# Patient Record
Sex: Male | Born: 1960 | Race: Black or African American | Hispanic: No | Marital: Married | Smoking: Never smoker
Health system: Southern US, Community
[De-identification: ages and names within clinical notes are randomized; demographics above are authoritative.]

## PROBLEM LIST (undated history)

## (undated) DIAGNOSIS — K219 Gastro-esophageal reflux disease without esophagitis: Secondary | ICD-10-CM

---

## 2015-02-01 ENCOUNTER — Encounter (HOSPITAL_COMMUNITY): Payer: Self-pay | Admitting: Emergency Medicine

## 2015-02-01 ENCOUNTER — Emergency Department (HOSPITAL_COMMUNITY)
Admission: EM | Admit: 2015-02-01 | Discharge: 2015-02-01 | Disposition: A | Discharge status: Home / Self Care | Payer: Medicaid Other | Attending: Emergency Medicine | Admitting: Emergency Medicine

## 2015-02-01 DIAGNOSIS — Z8719 Personal history of other diseases of the digestive system: Secondary | ICD-10-CM | POA: Insufficient documentation

## 2015-02-01 DIAGNOSIS — B86 Scabies: Secondary | ICD-10-CM | POA: Insufficient documentation

## 2015-02-01 DIAGNOSIS — R21 Rash and other nonspecific skin eruption: Secondary | ICD-10-CM | POA: Diagnosis present

## 2015-02-01 HISTORY — DX: Gastro-esophageal reflux disease without esophagitis: K21.9

## 2015-02-01 MED ORDER — PERMETHRIN 5 % EX CREA: TOPICAL_CREAM | CUTANEOUS | Status: AC

## 2015-02-01 MED ORDER — HYDROXYZINE HCL 25 MG PO TABS: 25.0000 mg | ORAL_TABLET | Freq: Four times a day (QID) | ORAL | Status: AC

## 2015-02-01 MED ORDER — PREDNISONE 20 MG PO TABS
60.0000 mg | ORAL_TABLET | Freq: Once | ORAL | Status: AC
  Administered 2015-02-01: 60 mg via ORAL
  Filled 2015-02-01: qty 3

## 2015-02-01 MED ORDER — HYDROXYZINE HCL 25 MG PO TABS
50.0000 mg | ORAL_TABLET | Freq: Once | ORAL | Status: AC
  Administered 2015-02-01: 50 mg via ORAL
  Filled 2015-02-01: qty 2

## 2015-02-01 NOTE — Discharge Instructions (Signed)

## 2015-02-01 NOTE — ED Notes (Signed)
Pt reporting rash after being bit by bugs several days ago. No resp distress noted

## 2015-02-01 NOTE — ED Provider Notes (Signed)
CSN: [id number]     Arrival date & time 02/01/15  0306 History  By signing my name below, I, Shawn Carpenter, attest that this documentation has been prepared under the direction and in the presence of Rolland Porter, MD . Electronically Signed: Freida Carpenter, Scribe. 02/01/2015. 3:43 AM.   Chief Complaint  Patient presents with  . Rash   The history is provided by the patient. No language interpreter was used.    HPI Comments:  Shawn Carpenter is a 54 y.o. male who presents to the Emergency Department complaining of a diffuse rash which he first noticed a few days ago. He reports associated pruritus. Pt believes he may have been bitten by bed bugs or may have scabies. His wife has a similar rash. No alleviating factors or associated symptoms noted.  Past Medical History  Diagnosis Date  . GERD (gastroesophageal reflux disease)    History reviewed. No pertinent past surgical history. No family history on file. Social History  Substance Use Topics  . Smoking status: Never Smoker   . Smokeless tobacco: None  . Alcohol Use: No    Review of Systems  Constitutional: Negative for fever, chills, diaphoresis, appetite change and fatigue.  HENT: Negative for mouth sores, sore throat and trouble swallowing.   Eyes: Negative for visual disturbance.  Respiratory: Negative for cough, chest tightness, shortness of breath and wheezing.   Cardiovascular: Negative for chest pain.  Gastrointestinal: Negative for nausea, vomiting, abdominal pain, diarrhea and abdominal distention.  Endocrine: Negative for polydipsia, polyphagia and polyuria.  Genitourinary: Negative for dysuria, frequency and hematuria.  Musculoskeletal: Negative for gait problem.  Skin: Positive for rash. Negative for color change and pallor.  Neurological: Negative for dizziness, syncope, light-headedness and headaches.  Hematological: Does not bruise/bleed easily.  Psychiatric/Behavioral: Negative for behavioral problems and  confusion.    Allergies  Aspirin and Tylenol  Home Medications   Prior to Admission medications   Medication Sig Start Date End Date Taking? Authorizing Provider  hydrOXYzine (ATARAX/VISTARIL) 25 MG tablet Take 1 tablet (25 mg total) by mouth every 6 (six) hours. 02/01/15   Rolland Porter, MD  permethrin (ELIMITE) 5 % cream Apply to skin from the neck down. Repeat in one week 02/01/15   Rolland Porter, MD   BP 119/71 mmHg  Pulse 83  Temp(Src) 98.1 F (36.7 C) (Oral)  Resp 16  SpO2 97% Physical Exam  Constitutional: He is oriented to person, place, and time. He appears well-developed and well-nourished. No distress.  HENT:  Head: Normocephalic.  Eyes: Conjunctivae are normal. Pupils are equal, round, and reactive to light. No scleral icterus.  Neck: Normal range of motion. Neck supple. No thyromegaly present.  Cardiovascular: Normal rate and regular rhythm.  Exam reveals no gallop and no friction rub.   No murmur heard. Pulmonary/Chest: Effort normal and breath sounds normal. No respiratory distress. He has no wheezes. He has no rales.  Abdominal: Soft. Bowel sounds are normal. He exhibits no distension. There is no tenderness. There is no rebound.  Musculoskeletal: Normal range of motion.  Neurological: He is alert and oriented to person, place, and time.  Skin: Skin is warm and dry.  Small areas of localized erythema with excoriations   Psychiatric: He has a normal mood and affect. His behavior is normal.  Nursing note and vitals reviewed.   ED Course  Procedures   DIAGNOSTIC STUDIES:  Oxygen Saturation is 97% on RA, normal by my interpretation.    COORDINATION OF CARE:  3:42 AM  Discussed treatment plan with pt at bedside and pt agreed to plan.  Labs Review Labs Reviewed - No data to display  Imaging Review No results found.   EKG Interpretation None      MDM   Final diagnoses:  Scabies    I personally performed the services described in this documentation,  which was scribed in my presence. The recorded information has been reviewed and is accurate.   Rolland PorterMark Brit Carbonell, MD 02/01/15 0430

## 2015-02-08 ENCOUNTER — Encounter (HOSPITAL_COMMUNITY): Payer: Self-pay | Admitting: Emergency Medicine

## 2015-02-08 ENCOUNTER — Emergency Department (HOSPITAL_COMMUNITY)
Admission: EM | Admit: 2015-02-08 | Discharge: 2015-02-09 | Disposition: A | Discharge status: Home / Self Care | Payer: Medicaid Other | Attending: Emergency Medicine | Admitting: Emergency Medicine

## 2015-02-08 DIAGNOSIS — Z8719 Personal history of other diseases of the digestive system: Secondary | ICD-10-CM | POA: Insufficient documentation

## 2015-02-08 DIAGNOSIS — Z79899 Other long term (current) drug therapy: Secondary | ICD-10-CM | POA: Insufficient documentation

## 2015-02-08 DIAGNOSIS — R21 Rash and other nonspecific skin eruption: Secondary | ICD-10-CM | POA: Diagnosis present

## 2015-02-08 DIAGNOSIS — B86 Scabies: Secondary | ICD-10-CM | POA: Diagnosis not present

## 2015-02-08 MED ORDER — HYDROXYZINE HCL 25 MG PO TABS: 25.0000 mg | ORAL_TABLET | Freq: Four times a day (QID) | ORAL | Status: AC | PRN

## 2015-02-08 MED ORDER — PERMETHRIN 5 % EX CREA: TOPICAL_CREAM | CUTANEOUS | Status: AC

## 2015-02-08 MED ORDER — HYDROCORTISONE 2.5 % EX CREA: TOPICAL_CREAM | Freq: Two times a day (BID) | CUTANEOUS | Status: AC | PRN

## 2015-02-08 NOTE — ED Notes (Signed)
Pt. reports generalized skin itching from insect bites for 3 weeks .

## 2015-02-08 NOTE — ED Provider Notes (Signed)
CSN: [id number]     Arrival date & time 02/08/15  2333 History  By signing my name below, I, Emmanuella Mensah, attest that this documentation has been prepared under the direction and in the presence of Shonta Bourque Camprubi-Soms, PA. Electronically Signed: Angelene Giovanni, ED Scribe. 02/08/2015. 11:50 PM.    Chief Complaint  Patient presents with  . Insect Bite   Patient is a 54 y.o. male presenting with rash. The history is provided by the patient. No language interpreter was used.  Rash Location:  Full body Quality: itchiness and redness   Quality: not draining and not painful   Severity:  Moderate Onset quality:  Gradual Duration:  1 week Timing:  Constant Progression:  Spreading Chronicity:  New Context: exposure to similar rash   Context: not animal contact, not food, not hot tub use, not insect bite/sting, not medications, not new detergent/soap and not plant contact   Relieved by:  Nothing Worsened by:  Heat Ineffective treatments:  Anti-itch cream Associated symptoms: no abdominal pain, no diarrhea, no fever, no joint pain, no myalgias, no nausea, no shortness of breath, no throat swelling, no tongue swelling, no URI and not vomiting    HPI Comments: Shawn Carpenter is a 54 y.o. male who presents to the Emergency Department complaining of ongoing spreading generalized red and itchy rash x1 week ago, worse with heat and unrelieved by hydroxyzine and permethrin given on 02/01/15. He states that he washed all his clothes and followed the prior discharge instructions, but continues to have the rash. Rash started after he slept on a mattress that he and his wife used before the moved. His wife has the same rash. He denies any tongue swelling, lip swelling, difficulty swallowing/breathing, fever, chills, CP, SOB, abdominal pain, n/v/d/c, dysuria, hematuria, numbness, tingling, or weakness. He denies any drainage from the rash.  He denies any new lotions, soaps, detergent, new pets or  animals. No plant/animal contacts. No changes in meds or foods. He denies any environmental allergies.   Past Medical History  Diagnosis Date  . GERD (gastroesophageal reflux disease)    History reviewed. No pertinent past surgical history. No family history on file. Social History  Substance Use Topics  . Smoking status: Never Smoker   . Smokeless tobacco: None  . Alcohol Use: No    Review of Systems  Constitutional: Negative for fever and chills.  HENT: Negative for facial swelling and trouble swallowing.   Respiratory: Negative for shortness of breath.   Cardiovascular: Negative for chest pain.  Gastrointestinal: Negative for nausea, vomiting, abdominal pain, diarrhea and constipation.  Genitourinary: Negative for dysuria and hematuria.  Musculoskeletal: Negative for myalgias and arthralgias.  Skin: Positive for rash.  Allergic/Immunologic: Negative for environmental allergies and immunocompromised state.  Neurological: Negative for weakness and numbness.   10 Systems reviewed and are negative for acute change except as noted in the HPI.    Allergies  Aspirin and Tylenol  Home Medications   Prior to Admission medications   Medication Sig Start Date End Date Taking? Authorizing Provider  hydrOXYzine (ATARAX/VISTARIL) 25 MG tablet Take 1 tablet (25 mg total) by mouth every 6 (six) hours. 02/01/15   Rolland Porter, MD  permethrin (ELIMITE) 5 % cream Apply to skin from the neck down. Repeat in one week 02/01/15   Rolland Porter, MD   BP 119/69 mmHg  Pulse 77  Temp(Src) 97.9 F (36.6 C) (Oral)  Resp 20  SpO2 97% Physical Exam  Constitutional: He is oriented to person, place,  and time. Vital signs are normal. He appears well-developed and well-nourished.  Non-toxic appearance. No distress.  Afebrile, nontoxic, NAD  HENT:  Head: Normocephalic and atraumatic.  Mouth/Throat: Mucous membranes are normal.  Eyes: Conjunctivae and EOM are normal. Right eye exhibits no discharge.  Left eye exhibits no discharge.  Neck: Normal range of motion. Neck supple.  Cardiovascular: Normal rate.   Pulmonary/Chest: Effort normal. No respiratory distress.  Abdominal: Normal appearance. He exhibits no distension.  Musculoskeletal: Normal range of motion.  Neurological: He is alert and oriented to person, place, and time. He has normal strength. No sensory deficit.  Skin: Skin is warm, dry and intact. Rash noted. Rash is maculopapular.  Diffuse maculopapular rash to entire body, some burrowing noted, no cellulitis or fluctuance, no intertrigonous involvement.  Psychiatric: He has a normal mood and affect.  Nursing note and vitals reviewed.   ED Course  Procedures (including critical care time) DIAGNOSTIC STUDIES: Oxygen Saturation is 97% on RA, adequate by my interpretation.    COORDINATION OF CARE: 11:50 PM- Pt advised of plan for treatment and pt agrees. Will repeat Scabies treatment. Follow up with PCP at [facility].     EKG Interpretation None      MDM   Final diagnoses:  Rash  Scabies    54 y.o. male here with diffuse rash, ongoing since last visit 1wk ago, was already treated for scabies but the rash continues to have appearance of scabies. Will retreat today. Doubt yeast infection. Doesn't look urticarial, no allergens in history, and wife with similar symptoms. Will send home with vistaril, permethrin, and hydrocortisone. F/up with PCP in 1wk. Discussed washing all fabrics in hot water, and wrapping everything fabric in plastic for 24hrs. I explained the diagnosis and have given explicit precautions to return to the ER including for any other new or worsening symptoms. The patient understands and accepts the medical plan as it's been dictated and I have answered their questions. Discharge instructions concerning home care and prescriptions have been given. The patient is STABLE and is discharged to home in good condition.  I personally performed the  services described in this documentation, which was scribed in my presence. The recorded information has been reviewed and is accurate.  BP 119/69 mmHg  Pulse 77  Temp(Src) 97.9 F (36.6 C) (Oral)  Resp 20  SpO2 97%  Meds ordered this encounter  Medications  . hydrOXYzine (ATARAX/VISTARIL) 25 MG tablet    Sig: Take 1 tablet (25 mg total) by mouth every 6 (six) hours as needed for itching.    Dispense:  28 tablet    Refill:  0    Order Specific Question:  Supervising Provider    Answer:  MILLER, BRIAN [[id number]]  . permethrin (ELIMITE) 5 % cream    Sig: Apply a generous amount of cream from head to feet, leave on for 8 to 14 hours, wash with soap/water    Dispense:  60 g    Refill:  0    Order Specific Question:  Supervising Provider    Answer:  Hyacinth MeekerMILLER, BRIAN [[id number]]  . hydrocortisone 2.5 % cream    Sig: Apply topically 2 (two) times daily as needed (itching).    Dispense:  15 g    Refill:  0    Order Specific Question:  Supervising Provider    Answer:  Eber HongMILLER, BRIAN [[id number]]     Shawn Westhoff Camprubi-Soms, PA-C 02/09/15 0005  Loren Raceravid Yelverton, MD 02/09/15 [phone]

## 2015-02-08 NOTE — Discharge Instructions (Signed)
Use permethrin and hydrocortisone lotion as prescribed, use vistaril as directed as needed for itching. Wash everything that is fabric in hot water, if you can't wash a fabric item them place it in a bag for a full 24 hours. Continue your usual home medications. Get plenty of rest and drink plenty of fluids. Avoid any known triggers. Please followup with your primary doctor for discussion of your diagnoses and further evaluation after today's visit; return to the ER for changes or worsening symptoms. ° ° °Scabies, Adult °Scabies is a skin condition that happens when very small insects get under the skin (infestation). This causes a rash and severe itchiness. Scabies can spread from person to person (is contagious). If you get scabies, it is common for others in your household to get scabies too. °With proper treatment, symptoms usually go away in 2-4 weeks. Scabies usually does not cause lasting problems. °CAUSES °This condition is caused by mites (Sarcoptes scabiei, or human itch mites) that can only be seen with a microscope. The mites get into the top layer of skin and lay eggs. Scabies can spread from person to person through: °· Close contact with a person who has scabies. °· Contact with infested items, such as towels, bedding, or clothing. °RISK FACTORS °This condition is more likely to develop in: °· People who live in nursing homes and other extended-care facilities. °· People who have sexual contact with a partner who has scabies. °· Young children who attend child care facilities. °· People who care for others who are at increased risk for scabies. °SYMPTOMS °Symptoms of this condition may include: °· Severe itchiness. This is often worse at night. °· A rash that includes tiny red bumps or blisters. The rash commonly occurs on the wrist, elbow, armpit, fingers, waist, groin, or buttocks. Bumps may form a line (burrow) in some areas. °· Skin irritation. This can include scaly patches or  sores. °DIAGNOSIS °This condition is diagnosed with a physical exam. Your health care provider will look closely at your skin. In some cases, your health care provider may take a sample of your affected skin (skin scraping) and have it examined under a microscope. °TREATMENT °This condition may be treated with: °· Medicated cream or lotion that kills the mites. This is spread on the entire body and left on for several hours. Usually, one treatment with medicated cream or lotion is enough to kill all of the mites. In severe cases, the treatment may be repeated. °· Medicated cream that relieves itching. °· Medicines that help to relieve itching. °· Medicines that kill the mites. This treatment is rarely used. °HOME CARE INSTRUCTIONS °Medicines °· Take or apply over-the-counter and prescription medicines as told by your health care provider. °· Apply medicated cream or lotion as told by your health care provider. °· Do not wash off the medicated cream or lotion until the necessary amount of time has passed. °Skin Care °· Avoid scratching your affected skin. °· Keep your fingernails closely trimmed to reduce injury from scratching. °· Take cool baths or apply cool washcloths to help reduce itching. °General Instructions °· Clean all items that you recently had contact with, including bedding, clothing, and furniture. Do this on the same day that your treatment starts. °¨ Use hot water when you wash items. °¨ Place unwashable items into closed, airtight plastic bags for at least 3 days. The mites cannot live for more than 3 days away from human skin. °¨ Vacuum furniture and mattresses that you use. °·   Make sure that other people who may have been infested are examined by a health care provider. These include members of your household and anyone who may have had contact with infested items. °· Keep all follow-up visits as told by your health care provider. This is important. °SEEK MEDICAL CARE IF: °· You have itching that  does not go away after 4 weeks of treatment. °· You continue to develop new bumps or burrows. °· You have redness, swelling, or pain in your rash area after treatment. °· You have fluid, blood, or pus coming from your rash. °  °This information is not intended to replace advice given to you by your health care provider. Make sure you discuss any questions you have with your health care provider. °  °Document Released: 12/26/2014 Document Reviewed: 11/06/2014 °Elsevier Interactive Patient Education ©2016 [employer] ° °

## 2016-09-23 ENCOUNTER — Emergency Department (HOSPITAL_COMMUNITY): Payer: Medicare Other

## 2016-09-23 ENCOUNTER — Emergency Department (HOSPITAL_COMMUNITY)
Admission: EM | Admit: 2016-09-23 | Discharge: 2016-09-23 | Disposition: A | Discharge status: Home / Self Care | Payer: Medicare Other | Attending: Emergency Medicine | Admitting: Emergency Medicine

## 2016-09-23 ENCOUNTER — Encounter (HOSPITAL_COMMUNITY): Payer: Self-pay | Admitting: Emergency Medicine

## 2016-09-23 DIAGNOSIS — M25551 Pain in right hip: Secondary | ICD-10-CM | POA: Insufficient documentation

## 2016-09-23 DIAGNOSIS — Y93E1 Activity, personal bathing and showering: Secondary | ICD-10-CM | POA: Diagnosis not present

## 2016-09-23 DIAGNOSIS — Y999 Unspecified external cause status: Secondary | ICD-10-CM | POA: Insufficient documentation

## 2016-09-23 DIAGNOSIS — Y92002 Bathroom of unspecified non-institutional (private) residence single-family (private) house as the place of occurrence of the external cause: Secondary | ICD-10-CM | POA: Diagnosis not present

## 2016-09-23 DIAGNOSIS — W182XXA Fall in (into) shower or empty bathtub, initial encounter: Secondary | ICD-10-CM | POA: Diagnosis not present

## 2016-09-23 DIAGNOSIS — M545 Low back pain, unspecified: Secondary | ICD-10-CM

## 2016-09-23 MED ORDER — TRAMADOL HCL 50 MG PO TABS: 50.0000 mg | ORAL_TABLET | Freq: Four times a day (QID) | ORAL | 0 refills | Status: AC | PRN

## 2016-09-23 NOTE — Discharge Instructions (Signed)
Take tramadol as prescribed for pain. Avoid strenuous activity. Follow up with family doctor. Xrays were normal today.

## 2016-09-23 NOTE — ED Provider Notes (Signed)
[facility]-EMERGENCY DEPT Provider Note   CSN: [id number] Arrival date & time: 09/23/16  1347   By signing my name below, I, [name], attest that this documentation has been prepared under the direction and in the presence of Anjenette Gerbino, PA-C. Electronically Signed: [name], Scribe. 09/23/2016. 2:12 PM.  History   Chief Complaint Chief Complaint  Patient presents with  . Fall  . Hip Pain   The history is provided by the patient. No language interpreter was used.     HPI Comments:  Shawn Carpenter is a 56 y.o. male who presents to the Emergency Department complaining of moderate constant right hip pain s/p fall this AM. Pt states he slipped and fell in the bathub this AM. He reports associated right knee pain and lower back pain following the incident. He denies ankle pain, and numbness/weakness in his extremities. No alleviating factors noted, no treatments tried PTA. Pt normally ambulates with a cane due to old left knee injury.    Past Medical History:  Diagnosis Date  . GERD (gastroesophageal reflux disease)     There are no active problems to display for this patient.   History reviewed. No pertinent surgical history.     Home Medications    Prior to Admission medications   Medication Sig Start Date End Date Taking? Authorizing Provider  hydrocortisone 2.5 % cream Apply topically 2 (two) times daily as needed (itching). 02/08/15   Street, [place], PA-C  hydrOXYzine (ATARAX/VISTARIL) 25 MG tablet Take 1 tablet (25 mg total) by mouth every 6 (six) hours. 02/01/15   Rolland Porter, MD  hydrOXYzine (ATARAX/VISTARIL) 25 MG tablet Take 1 tablet (25 mg total) by mouth every 6 (six) hours as needed for itching. 02/08/15   Street, [place], PA-C  permethrin (ELIMITE) 5 % cream Apply to skin from the neck down. Repeat in one week 02/01/15   Rolland Porter, MD  permethrin (ELIMITE) 5 % cream Apply a generous amount of cream from head to feet, leave on for 8 to 14 hours, wash  with soap/water 02/08/15   Street, [place], PA-C    Family History No family history on file.  Social History Social History  Substance Use Topics  . Smoking status: Never Smoker  . Smokeless tobacco: Not on file  . Alcohol use No     Allergies   Aspirin and Tylenol [acetaminophen]   Review of Systems Review of Systems  Musculoskeletal: Positive for arthralgias, back pain and myalgias.  Neurological: Negative for weakness and numbness.  All other systems reviewed and are negative.  Physical Exam Updated Vital Signs BP 130/84 (BP Location: Left Arm)   Pulse 90   Temp 98.6 F (37 C) (Oral)   Resp 16   Ht 5\' 11"  (1.803 m)   Wt 200 lb (90.7 kg)   SpO2 95%   BMI 27.89 kg/m   Physical Exam  Constitutional: He is oriented to person, place, and time. He appears well-developed and well-nourished. No distress.  HENT:  Head: Normocephalic and atraumatic.  Eyes: Conjunctivae are normal.  Cardiovascular: Normal rate.   Pulmonary/Chest: Effort normal.  Abdominal: He exhibits no distension.  Musculoskeletal:  Tenderness to palpation over midline lumbar spine and right SI joint. Tenderness over lateral right hip. Pain with any range of motion of the hip joint. Normal right knee with no obvious swelling or joint effusion. Full range of motion of the knee joint. Joint is stable with negative anterior posterior drawer signs. No laxity with medial lateral stress. Distal pulses  intact.  Neurological: He is alert and oriented to person, place, and time.  Skin: Skin is warm and dry.  Nursing note and vitals reviewed.    ED Treatments / Results  DIAGNOSTIC STUDIES:  Oxygen Saturation is 95% on RA, adequate by my interpretation.    COORDINATION OF CARE:  2:11 PM Discussed treatment plan with pt at bedside and pt agreed to plan.  Labs (all labs ordered are listed, but only abnormal results are displayed) Labs Reviewed - No data to display  EKG  EKG Interpretation None        Radiology No results found.  Procedures Procedures (including critical care time)  Medications Ordered in ED Medications - No data to display   Initial Impression / Assessment and Plan / ED Course  I have reviewed the triage vital signs and the nursing notes.  Pertinent labs & imaging results that were available during my care of the patient were reviewed by me and considered in my medical decision making (see chart for details).     Patient in emergency department after a fall and complaining of lower back, right hip, right knee pain. Patient is ambulatory with no difficulty. X-rays of the lumbar spine and right hip obtained and show degenerative changes otherwise unremarkable. Will discharge home with tramadol, he is allergic to NSAIDs and Tylenol. Follow with primary care doctor as needed.  Vitals:   09/23/16 1355  BP: 130/84  Pulse: 90  Resp: 16  Temp: 98.6 F (37 C)  TempSrc: Oral  SpO2: 95%  Weight: 90.7 kg (200 lb)  Height: 5\' 11"  (1.803 m)     Final Clinical Impressions(s) / ED Diagnoses   Final diagnoses:  None    New Prescriptions New Prescriptions   TRAMADOL (ULTRAM) 50 MG TABLET    Take 1 tablet (50 mg total) by mouth every 6 (six) hours as needed.   I personally performed the services described in this documentation, which was scribed in my presence. The recorded information has been reviewed and is accurate.     Jaynie CrumbleKirichenko, Kenzli Barritt, PA-C 09/23/16 1525    Nira Connardama, Pedro Eduardo, MD 09/26/16 [phone]

## 2016-09-23 NOTE — ED Notes (Signed)
Pt states he slipped and fell in the bathtub this am. C/o right hip pain. Is walking with a cane due to "knee problems".  Walks with minimal limp.

## 2016-09-23 NOTE — ED Triage Notes (Signed)
Pt states he fell in the bathtub today and slipped and fell and hit his R hip. Pt is ambulatory.

## 2016-09-23 NOTE — ED Notes (Signed)
Patient transported to X-ray 

## 2018-06-20 IMAGING — CR DG LUMBAR SPINE COMPLETE 4+V
5 series · 5 of 5 positions shown · non-contrast
Comparison: None.

CLINICAL DATA: Fall in bathtub with right flank pain

EXAM:
LUMBAR SPINE - COMPLETE 4+ VIEW

[l-spine ap]
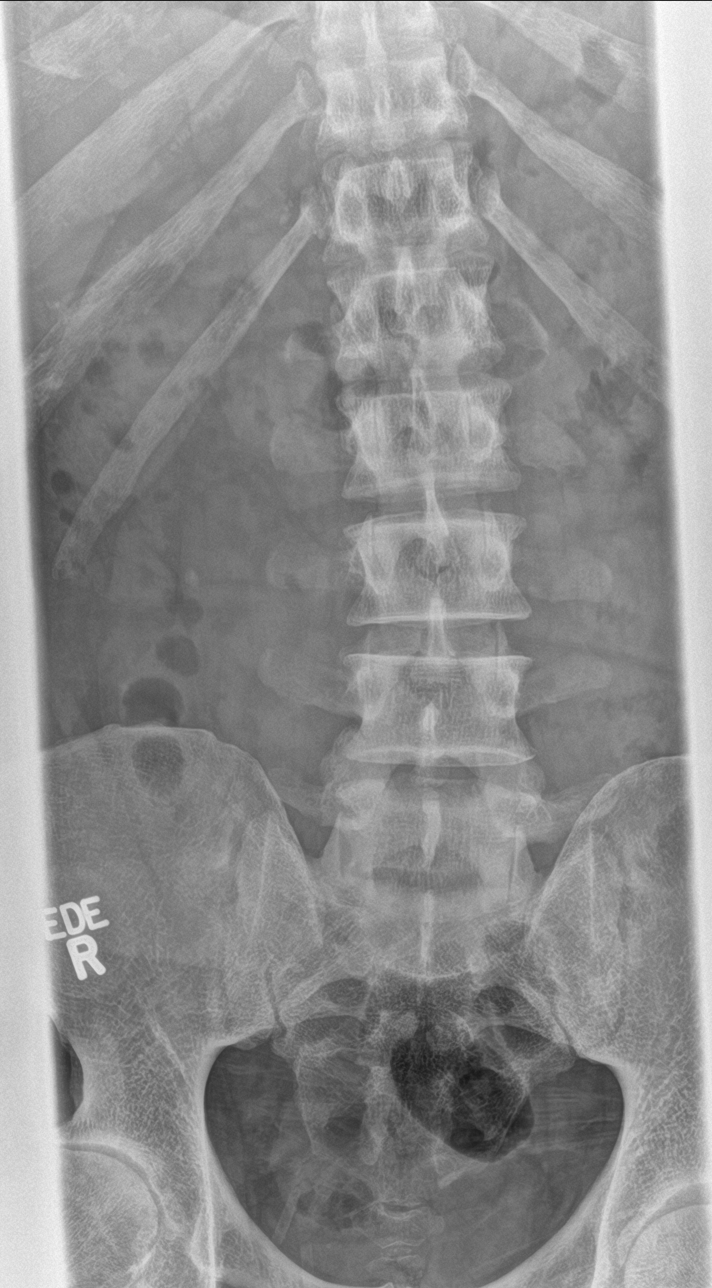

[l-spine obl (1 of 2)]
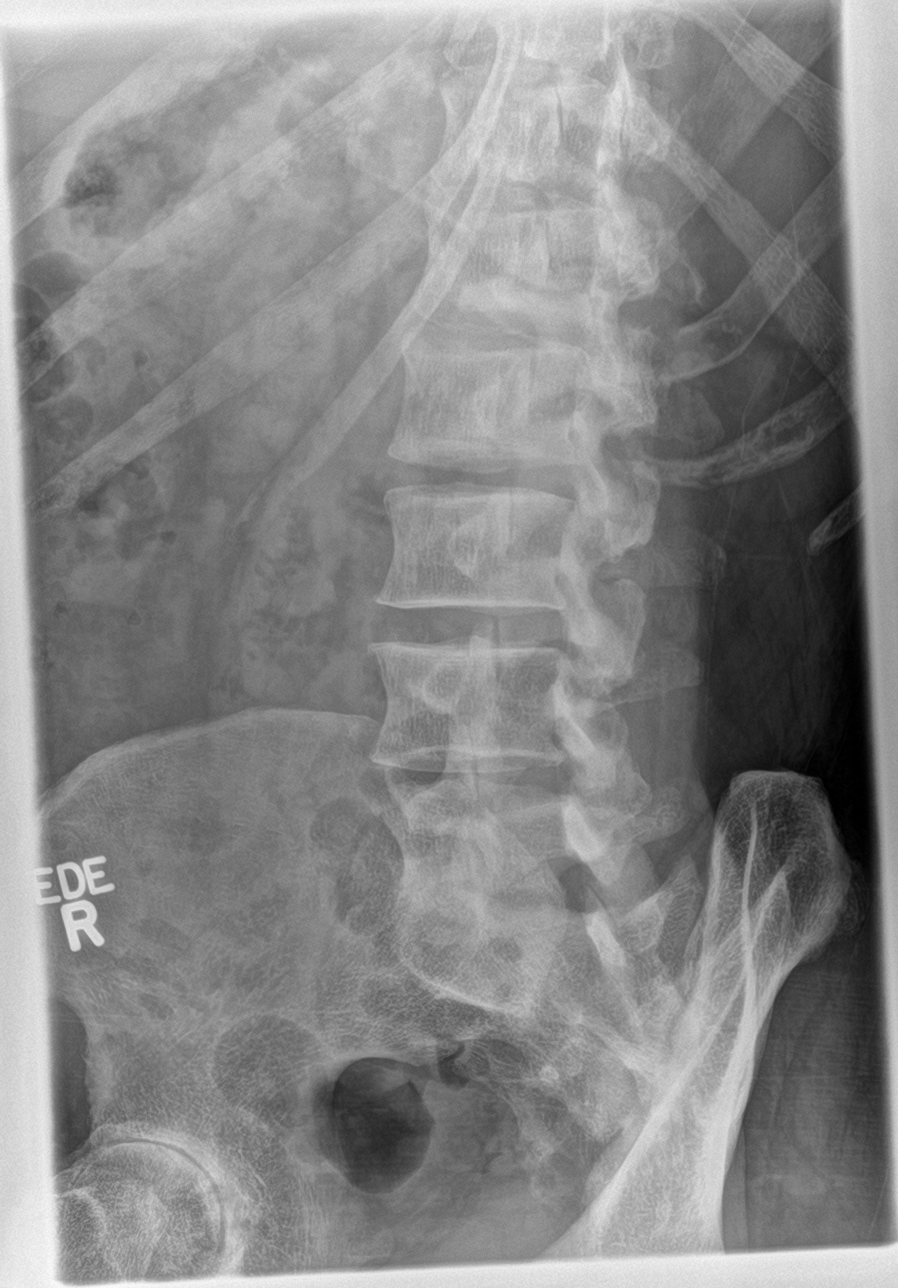

[l-spine obl (2 of 2)]
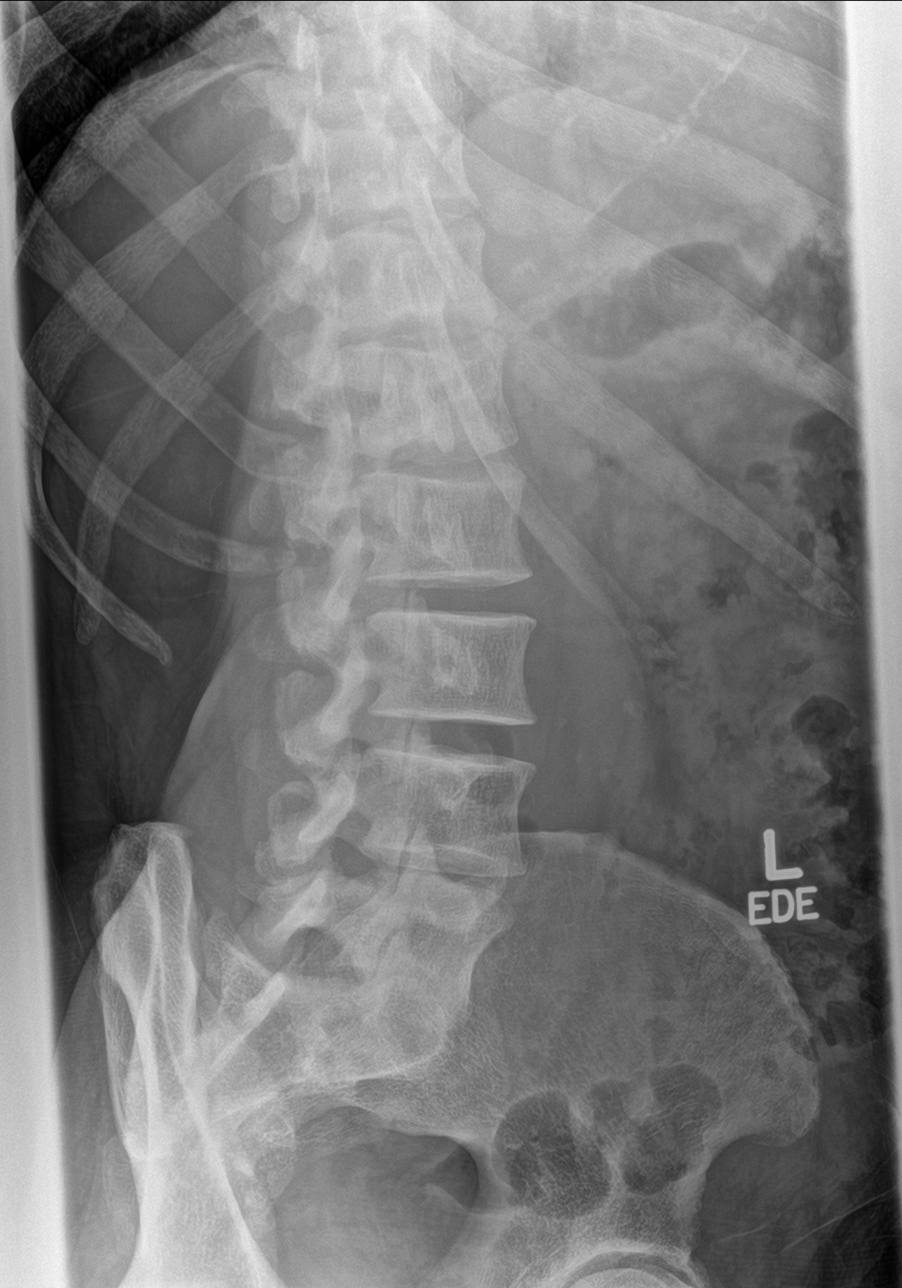

[l-spine lat]
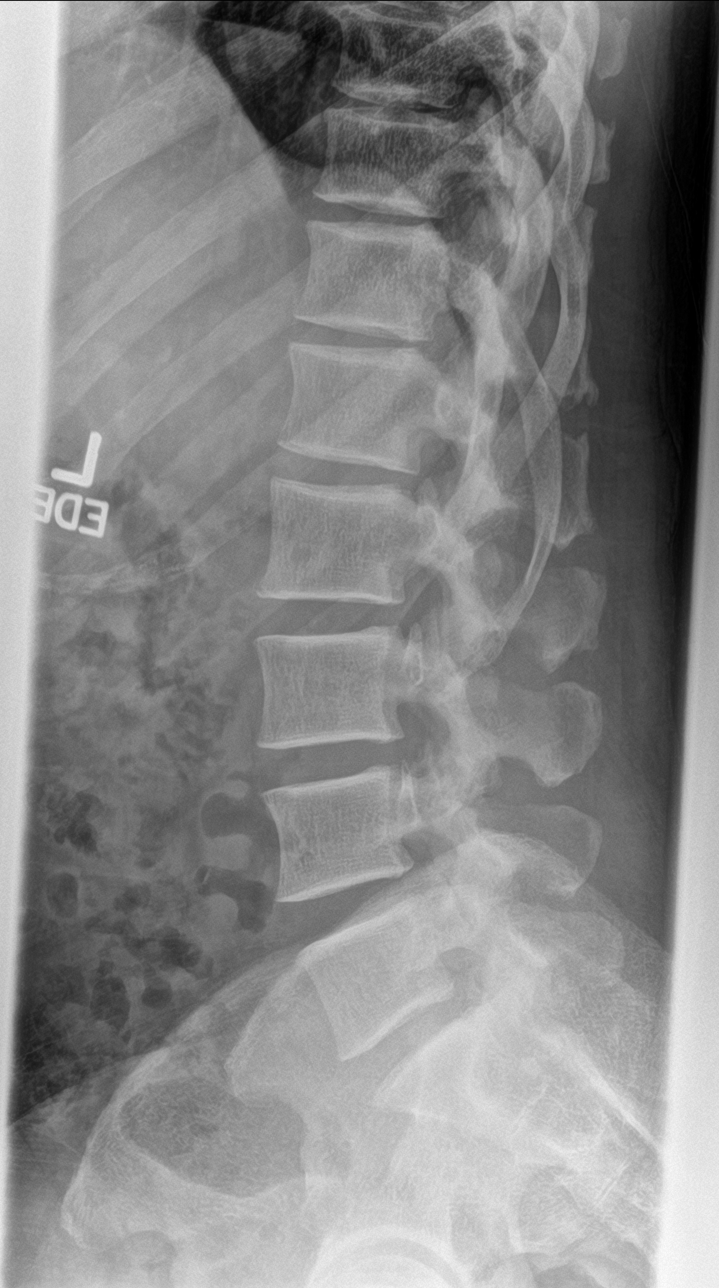

[l-spine spot]
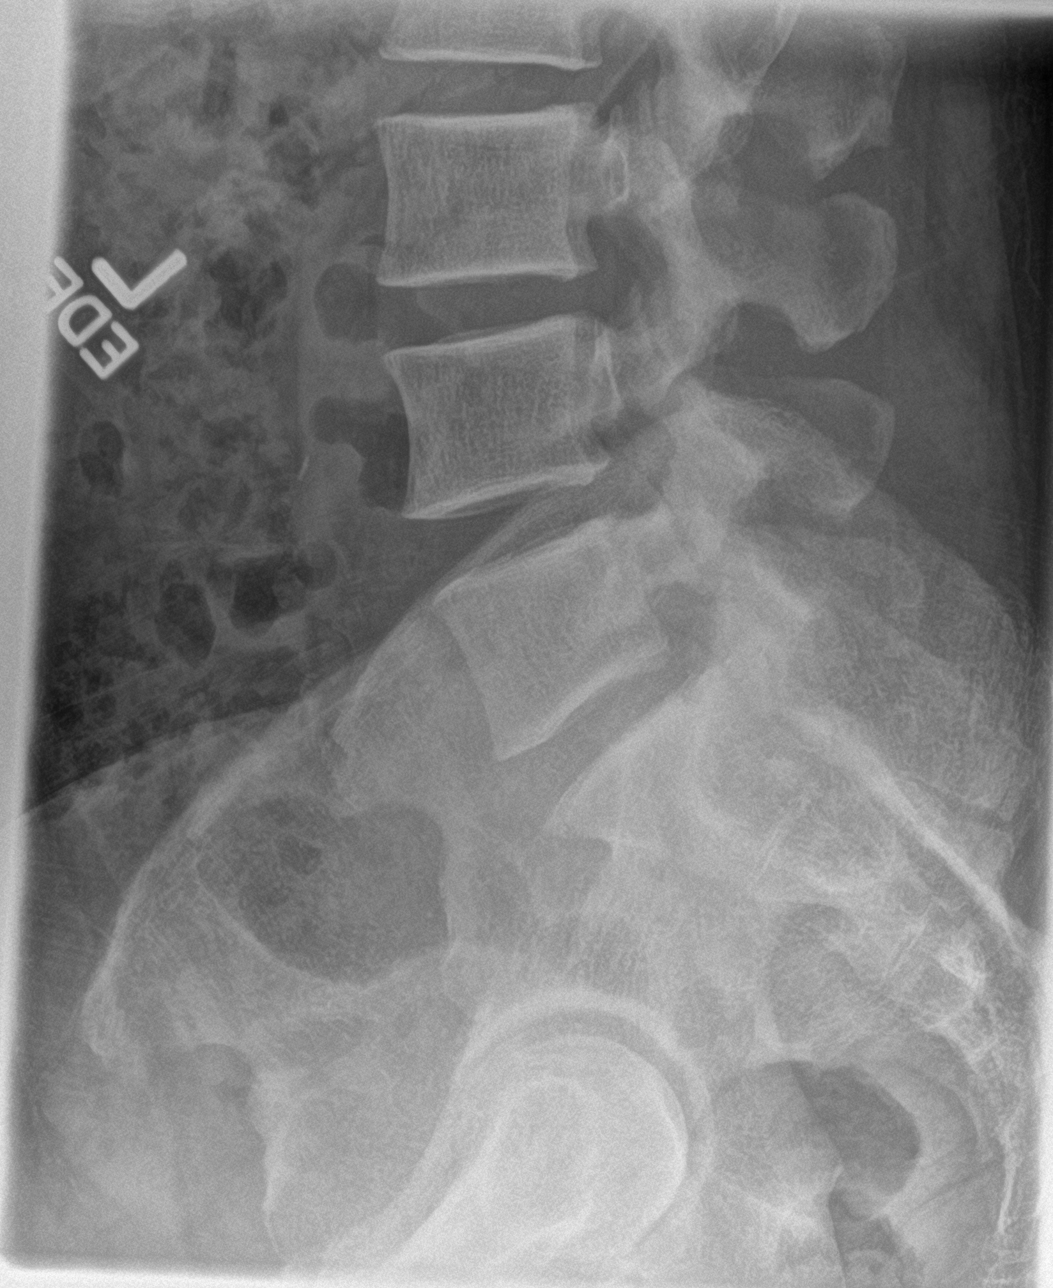

[5 of 5 positions shown; findings below may reference images not displayed]

FINDINGS: Alignment is within normal limits. Vertebral body heights are
maintained. Disc spaces are preserved.
IMPRESSION: No acute osseous abnormality

## 2018-06-20 IMAGING — CR DG HIP (WITH OR WITHOUT PELVIS) 2-3V*R*
3 series · 3 of 3 positions shown · non-contrast
Comparison: None.

CLINICAL DATA: Fell in bathtub with right hip pain

EXAM:
DG HIP (WITH OR WITHOUT PELVIS) 2-3V RIGHT

[pelvis ap]
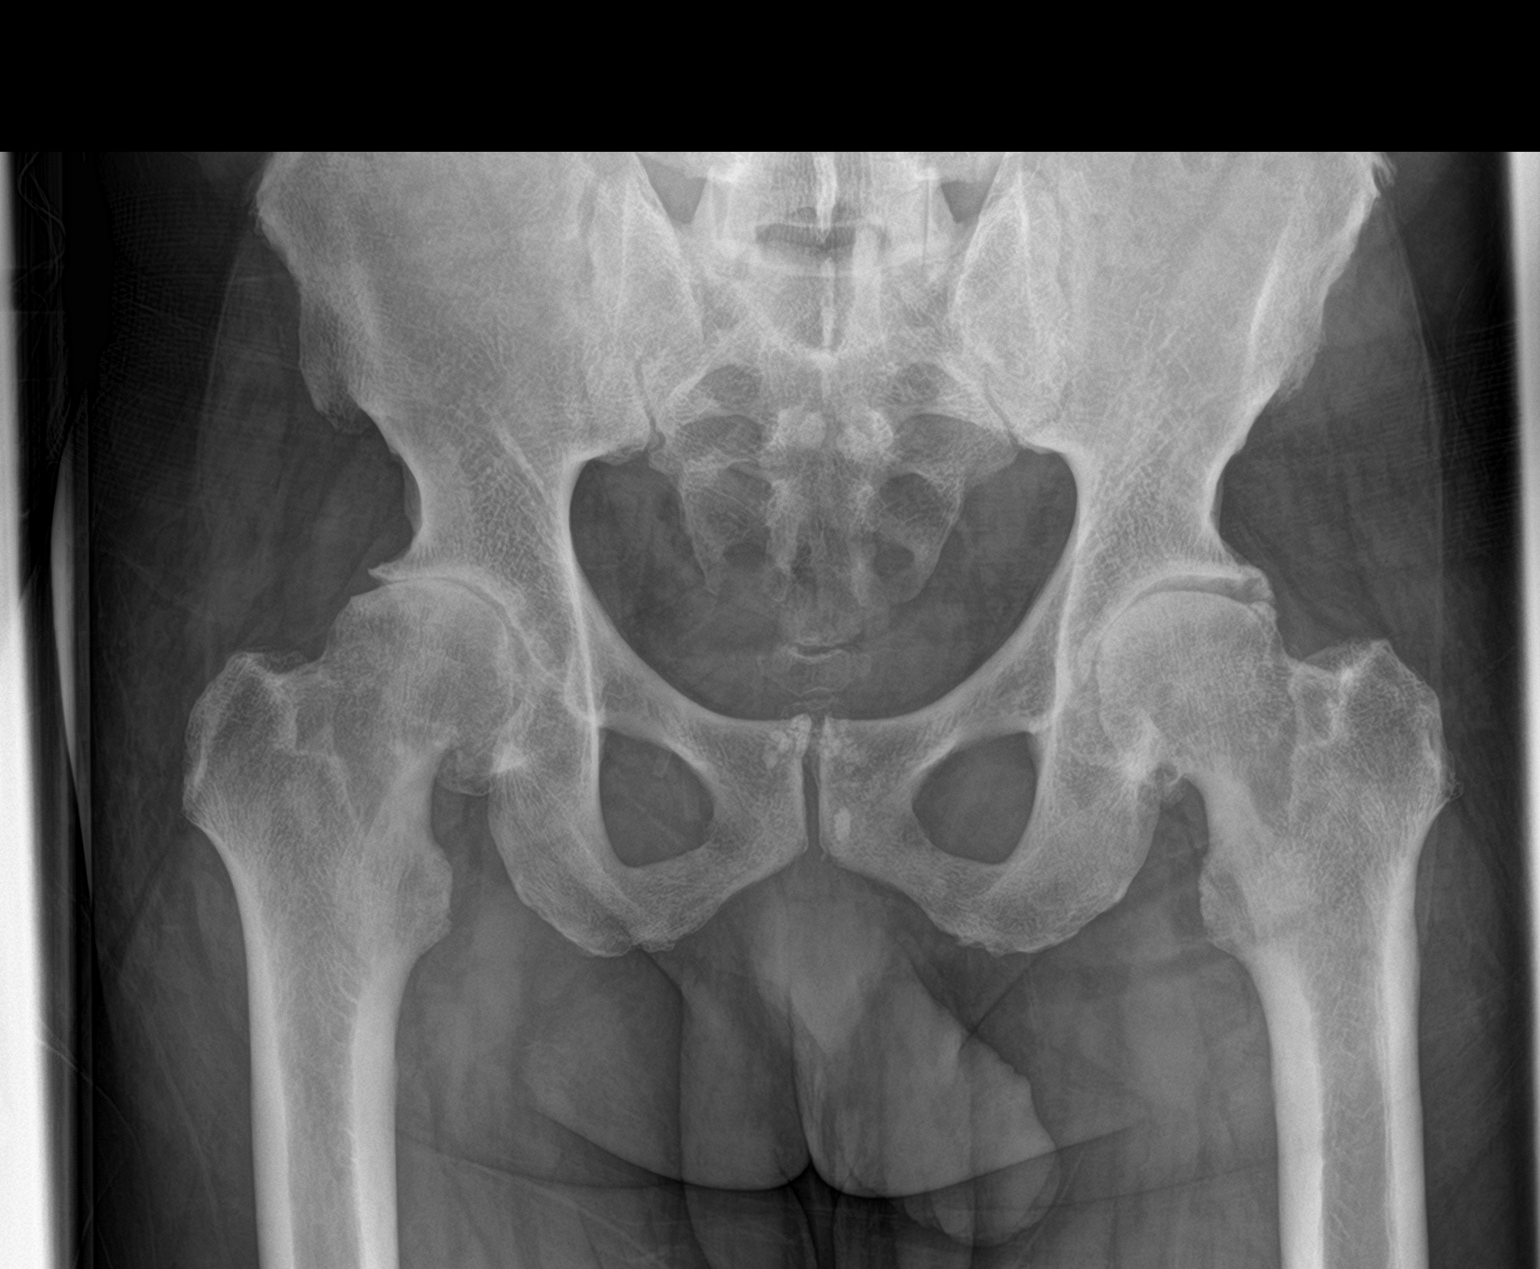

[hip ap]
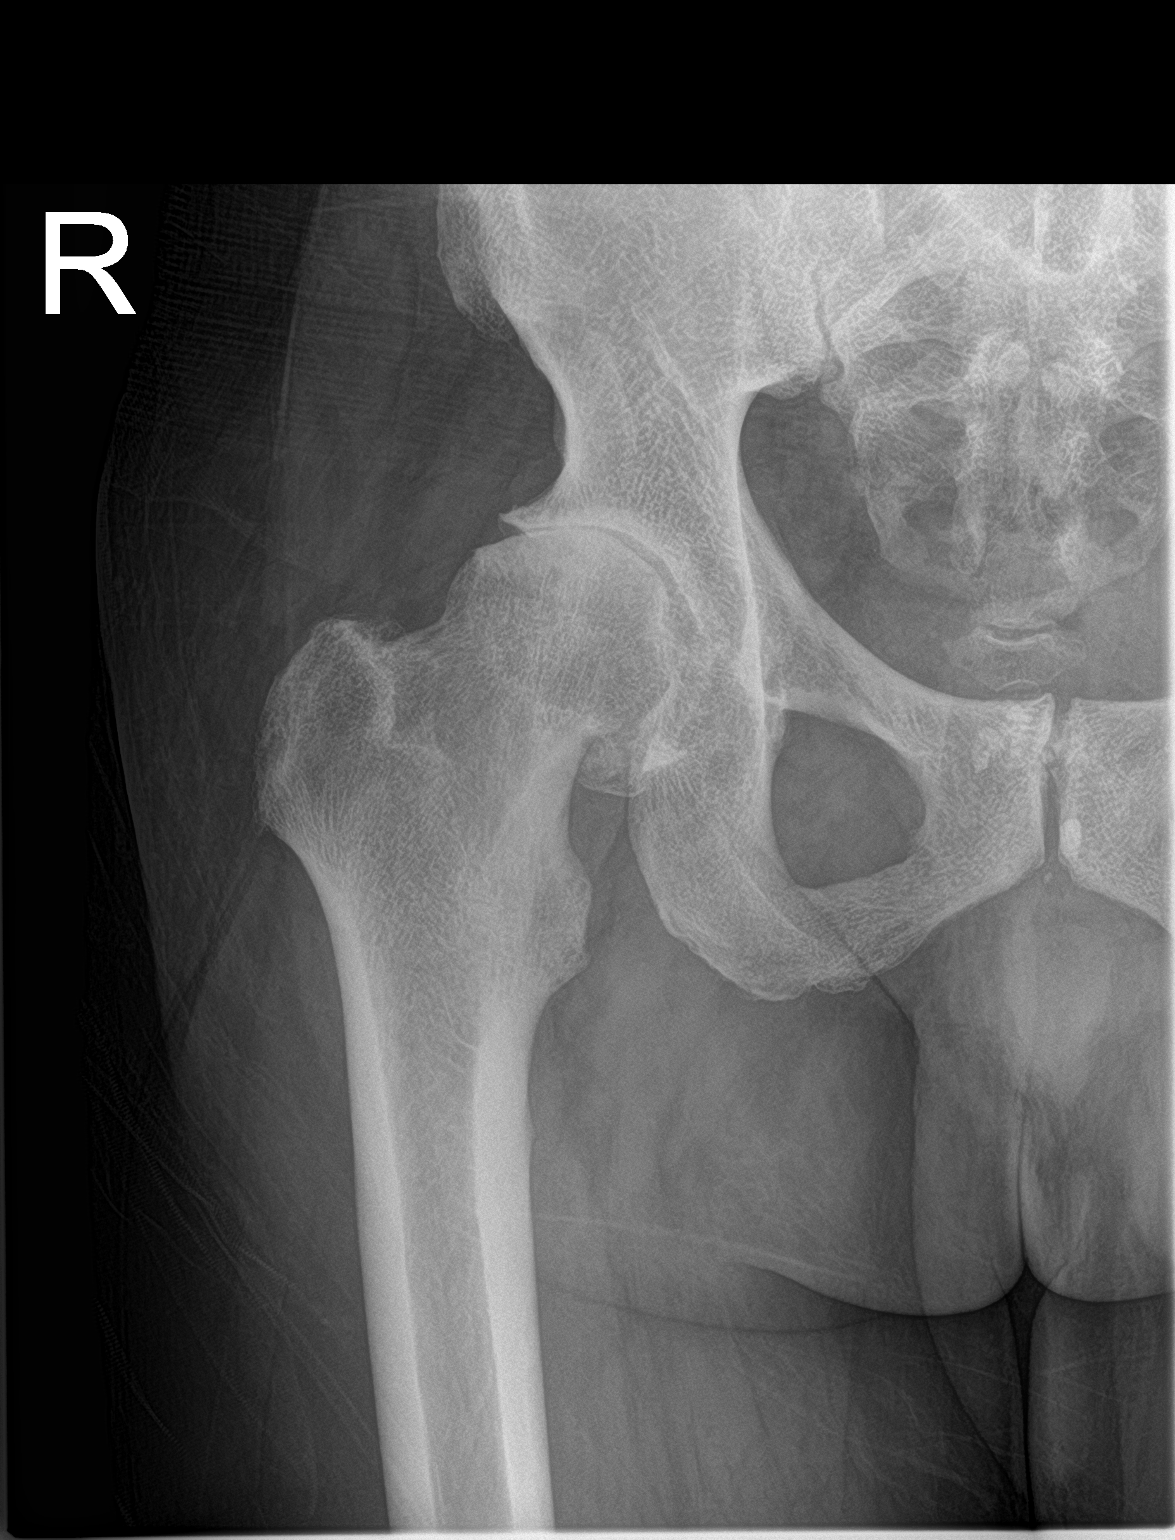

[hip lat]
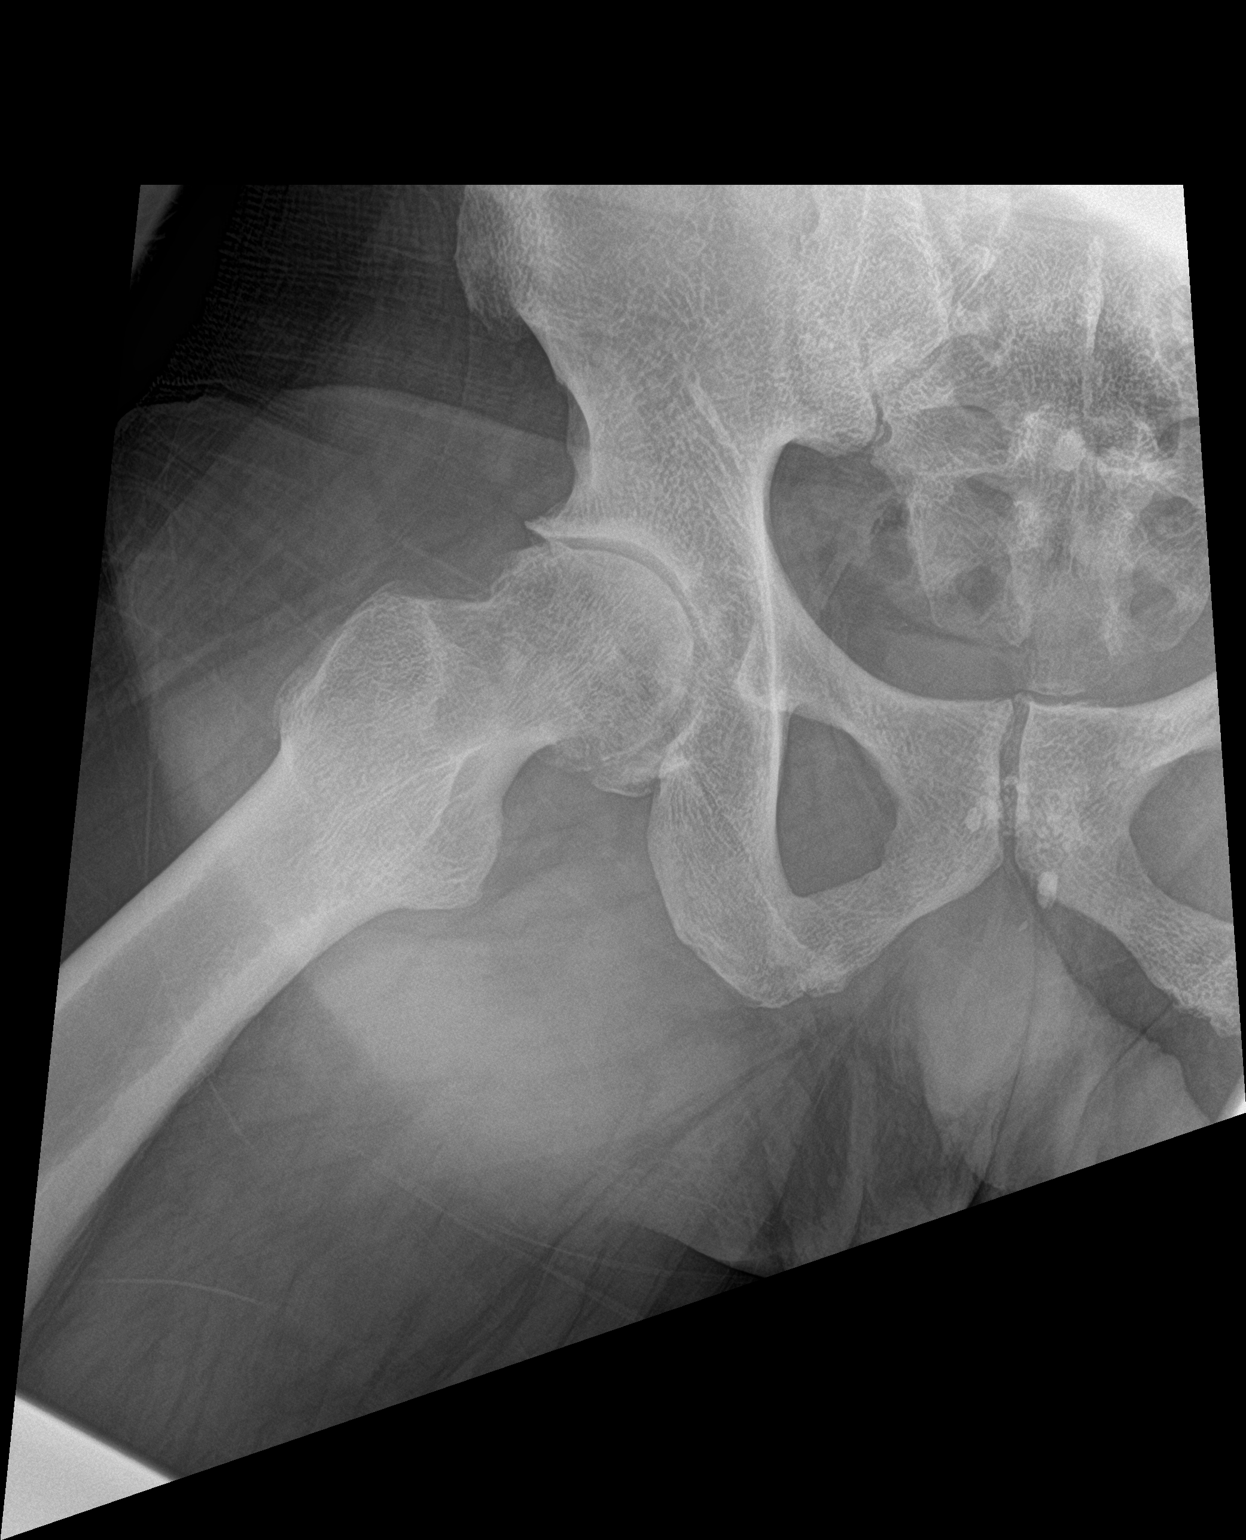

[3 of 3 positions shown; findings below may reference images not displayed]

FINDINGS: SI joints are symmetric. The pubic symphysis and rami are intact. No
fracture or dislocation. Advanced arthritis of the bilateral hips
right greater than left with narrowing and sclerosis.
IMPRESSION: 1. No acute osseous abnormality
2. Advanced degenerative changes of the right greater than left
hips.
